# Patient Record
Sex: Female | Born: 2005 | Race: White | Hispanic: No | Marital: Single | Smoking: Never smoker
Health system: Southern US, Community
[De-identification: ages and names within clinical notes are randomized; demographics above are authoritative.]

## PROBLEM LIST (undated history)

## (undated) DIAGNOSIS — F909 Attention-deficit hyperactivity disorder, unspecified type: Secondary | ICD-10-CM

## (undated) DIAGNOSIS — G40B09 Juvenile myoclonic epilepsy, not intractable, without status epilepticus: Secondary | ICD-10-CM

## (undated) DIAGNOSIS — R569 Unspecified convulsions: Secondary | ICD-10-CM

## (undated) DIAGNOSIS — F419 Anxiety disorder, unspecified: Secondary | ICD-10-CM

## (undated) HISTORY — PX: UPPER ENDOSCOPY W/ ESOPHAGEAL MANOMETRY: SHX2604

## (undated) HISTORY — PX: TYMPANOSTOMY TUBE PLACEMENT: SHX32

## (undated) HISTORY — DX: Anxiety disorder, unspecified: F41.9

## (undated) HISTORY — DX: Attention-deficit hyperactivity disorder, unspecified type: F90.9

## (undated) HISTORY — PX: DENTAL SURGERY: SHX609

## (undated) HISTORY — PX: TONSILLECTOMY AND ADENOIDECTOMY: SUR1326

---
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
## 2023-02-02 NOTE — Telephone Encounter (Signed)
A user error has taken place: encounter opened in error, closed for administrative reasons.
